# Patient Record
Sex: Female | Born: 1990 | Race: White | Hispanic: No | Marital: Single | Smoking: Never smoker
Health system: Southern US, Community
[De-identification: ages and names within clinical notes are randomized; demographics above are authoritative.]

## PROBLEM LIST (undated history)

## (undated) DIAGNOSIS — R519 Headache, unspecified: Secondary | ICD-10-CM

## (undated) DIAGNOSIS — D649 Anemia, unspecified: Secondary | ICD-10-CM

## (undated) DIAGNOSIS — R87629 Unspecified abnormal cytological findings in specimens from vagina: Secondary | ICD-10-CM

## (undated) DIAGNOSIS — R51 Headache: Secondary | ICD-10-CM

## (undated) HISTORY — PX: WISDOM TOOTH EXTRACTION: SHX21

## (undated) HISTORY — PX: HERNIA REPAIR: SHX51

---

## 2008-10-18 HISTORY — PX: COLPOSCOPY: SHX161

## 2014-05-16 ENCOUNTER — Other Ambulatory Visit (HOSPITAL_COMMUNITY): Payer: Self-pay | Admitting: Obstetrics and Gynecology

## 2014-05-16 DIAGNOSIS — I82890 Acute embolism and thrombosis of other specified veins: Secondary | ICD-10-CM

## 2014-05-17 ENCOUNTER — Inpatient Hospital Stay (HOSPITAL_COMMUNITY): Admission: RE | Admit: 2014-05-17 | Payer: Managed Care, Other (non HMO) | Source: Ambulatory Visit

## 2014-05-17 ENCOUNTER — Ambulatory Visit (HOSPITAL_COMMUNITY)
Admission: RE | Admit: 2014-05-17 | Discharge: 2014-05-17 | Disposition: A | Discharge status: Home / Self Care | Payer: Managed Care, Other (non HMO) | Source: Ambulatory Visit | Attending: Obstetrics and Gynecology | Admitting: Obstetrics and Gynecology

## 2014-05-17 DIAGNOSIS — I82890 Acute embolism and thrombosis of other specified veins: Secondary | ICD-10-CM

## 2014-05-17 DIAGNOSIS — M79609 Pain in unspecified limb: Secondary | ICD-10-CM | POA: Insufficient documentation

## 2014-05-20 ENCOUNTER — Ambulatory Visit (HOSPITAL_COMMUNITY): Payer: Managed Care, Other (non HMO)

## 2014-05-27 LAB — OB RESULTS CONSOLE GC/CHLAMYDIA
Chlamydia: NEGATIVE
Gonorrhea: NEGATIVE

## 2014-05-27 LAB — OB RESULTS CONSOLE RPR: RPR: NONREACTIVE

## 2014-05-27 LAB — OB RESULTS CONSOLE GBS: STREP GROUP B AG: POSITIVE

## 2014-05-27 LAB — OB RESULTS CONSOLE RUBELLA ANTIBODY, IGM: Rubella: IMMUNE

## 2014-05-27 LAB — OB RESULTS CONSOLE ABO/RH: RH TYPE: POSITIVE

## 2014-05-27 LAB — OB RESULTS CONSOLE HEPATITIS B SURFACE ANTIGEN: Hepatitis B Surface Ag: NEGATIVE

## 2014-05-27 LAB — OB RESULTS CONSOLE HIV ANTIBODY (ROUTINE TESTING): HIV: NONREACTIVE

## 2014-05-27 LAB — OB RESULTS CONSOLE ANTIBODY SCREEN: Antibody Screen: NEGATIVE

## 2014-10-18 NOTE — L&D Delivery Note (Signed)
Delivery Note Pt progressed to complete and pushed well.  At 12:15 PM a viable female was delivered via Vaginal, Spontaneous Delivery (Presentation: Left Occiput Anterior).  APGAR: 8, 9; weight pending.   Placenta status: Intact, Spontaneous.  Cord:  with the following complications: None.   Anesthesia: Epidural  Episiotomy: None Lacerations: None Suture Repair: none Est. Blood Loss (mL): 300  Mom to postpartum.  Baby to Couplet care / Skin to Skin.  Will continue [place] and clinda at least overnight, placenta to path.  Hans Rusher D 12/08/2014, 12:30 PM

## 2014-11-13 LAB — OB RESULTS CONSOLE GBS: GBS: POSITIVE

## 2014-12-08 ENCOUNTER — Inpatient Hospital Stay (HOSPITAL_COMMUNITY)
Admission: AD | Admit: 2014-12-08 | Discharge: 2014-12-10 | DRG: 775 | Disposition: A | Discharge status: Home / Self Care | Payer: Managed Care, Other (non HMO) | Source: Ambulatory Visit | Attending: Obstetrics and Gynecology | Admitting: Obstetrics and Gynecology

## 2014-12-08 ENCOUNTER — Inpatient Hospital Stay (HOSPITAL_COMMUNITY): Payer: Managed Care, Other (non HMO) | Admitting: Anesthesiology

## 2014-12-08 ENCOUNTER — Encounter (HOSPITAL_COMMUNITY): Payer: Self-pay

## 2014-12-08 DIAGNOSIS — O411030 Infection of amniotic sac and membranes, unspecified, third trimester, not applicable or unspecified: Secondary | ICD-10-CM | POA: Diagnosis present

## 2014-12-08 DIAGNOSIS — O99824 Streptococcus B carrier state complicating childbirth: Secondary | ICD-10-CM | POA: Diagnosis present

## 2014-12-08 DIAGNOSIS — Z3A38 38 weeks gestation of pregnancy: Secondary | ICD-10-CM | POA: Diagnosis present

## 2014-12-08 DIAGNOSIS — O471 False labor at or after 37 completed weeks of gestation: Secondary | ICD-10-CM | POA: Diagnosis present

## 2014-12-08 HISTORY — DX: Headache, unspecified: R51.9

## 2014-12-08 HISTORY — DX: Headache: R51

## 2014-12-08 HISTORY — DX: Unspecified abnormal cytological findings in specimens from vagina: R87.629

## 2014-12-08 HISTORY — DX: Anemia, unspecified: D64.9

## 2014-12-08 LAB — CBC
HCT: 35.6 % — ABNORMAL LOW (ref 36.0–46.0)
Hemoglobin: 12 g/dL (ref 12.0–15.0)
MCH: 31.7 pg (ref 26.0–34.0)
MCHC: 33.7 g/dL (ref 30.0–36.0)
MCV: 94.2 fL (ref 78.0–100.0)
PLATELETS: 289 10*3/uL (ref 150–400)
RBC: 3.78 MIL/uL — ABNORMAL LOW (ref 3.87–5.11)
RDW: 13.8 % (ref 11.5–15.5)
WBC: 22.1 10*3/uL — ABNORMAL HIGH (ref 4.0–10.5)

## 2014-12-08 LAB — POCT FERN TEST: POCT Fern Test: POSITIVE

## 2014-12-08 LAB — ABO/RH: ABO/RH(D): A POS

## 2014-12-08 LAB — TYPE AND SCREEN
ABO/RH(D): A POS
Antibody Screen: NEGATIVE

## 2014-12-08 MED ORDER — ACETAMINOPHEN 500 MG PO TABS
1000.0000 mg | ORAL_TABLET | Freq: Four times a day (QID) | ORAL | Status: DC | PRN
  Administered 2014-12-08: 1000 mg via ORAL
  Filled 2014-12-08: qty 2

## 2014-12-08 MED ORDER — OXYTOCIN BOLUS FROM INFUSION: 500.0000 mL | INTRAVENOUS | Status: DC

## 2014-12-08 MED ORDER — GENTAMICIN SULFATE 40 MG/ML IJ SOLN
Freq: Once | INTRAVENOUS | Status: AC
  Administered 2014-12-08: 08:00:00 via INTRAVENOUS
  Filled 2014-12-08: qty 4.25

## 2014-12-08 MED ORDER — EPHEDRINE 5 MG/ML INJ
10.0000 mg | INTRAVENOUS | Status: DC | PRN
Start: 2014-12-08 — End: 2014-12-08
  Filled 2014-12-08: qty 2

## 2014-12-08 MED ORDER — BUTORPHANOL TARTRATE 1 MG/ML IJ SOLN: 1.0000 mg | INTRAMUSCULAR | Status: DC | PRN

## 2014-12-08 MED ORDER — ZOLPIDEM TARTRATE 5 MG PO TABS: 5.0000 mg | ORAL_TABLET | Freq: Every evening | ORAL | Status: DC | PRN

## 2014-12-08 MED ORDER — ACETAMINOPHEN 325 MG PO TABS: 650.0000 mg | ORAL_TABLET | ORAL | Status: DC | PRN

## 2014-12-08 MED ORDER — WITCH HAZEL-GLYCERIN EX PADS: 1.0000 "application " | MEDICATED_PAD | CUTANEOUS | Status: DC | PRN

## 2014-12-08 MED ORDER — PHENYLEPHRINE 40 MCG/ML (10ML) SYRINGE FOR IV PUSH (FOR BLOOD PRESSURE SUPPORT)
80.0000 ug | PREFILLED_SYRINGE | INTRAVENOUS | Status: DC | PRN
  Filled 2014-12-08: qty 20
  Filled 2014-12-08: qty 2

## 2014-12-08 MED ORDER — CLINDAMYCIN PHOSPHATE 900 MG/50ML IV SOLN
900.0000 mg | Freq: Three times a day (TID) | INTRAVENOUS | Status: DC
  Filled 2014-12-08 (×2): qty 50

## 2014-12-08 MED ORDER — EPHEDRINE 5 MG/ML INJ
10.0000 mg | INTRAVENOUS | Status: DC | PRN
  Filled 2014-12-08: qty 2

## 2014-12-08 MED ORDER — GENTAMICIN SULFATE 40 MG/ML IJ SOLN
Freq: Once | INTRAMUSCULAR | Status: DC
  Filled 2014-12-08: qty 4.25

## 2014-12-08 MED ORDER — CEFAZOLIN SODIUM-DEXTROSE 2-3 GM-% IV SOLR
2.0000 g | Freq: Once | INTRAVENOUS | Status: AC
Start: 2014-12-08 — End: 2014-12-08
  Administered 2014-12-08: 2 g via INTRAVENOUS
  Filled 2014-12-08: qty 50

## 2014-12-08 MED ORDER — OXYCODONE-ACETAMINOPHEN 5-325 MG PO TABS: 2.0000 | ORAL_TABLET | ORAL | Status: DC | PRN

## 2014-12-08 MED ORDER — DIPHENHYDRAMINE HCL 50 MG/ML IJ SOLN: 12.5000 mg | INTRAMUSCULAR | Status: DC | PRN

## 2014-12-08 MED ORDER — SENNOSIDES-DOCUSATE SODIUM 8.6-50 MG PO TABS
2.0000 | ORAL_TABLET | ORAL | Status: DC
  Administered 2014-12-09 (×2): 2 via ORAL
  Filled 2014-12-08 (×2): qty 2

## 2014-12-08 MED ORDER — OXYTOCIN 40 UNITS IN LACTATED RINGERS INFUSION - SIMPLE MED
1.0000 m[IU]/min | INTRAVENOUS | Status: DC
  Administered 2014-12-08: 2 m[IU]/min via INTRAVENOUS
  Filled 2014-12-08: qty 1000

## 2014-12-08 MED ORDER — OXYCODONE-ACETAMINOPHEN 5-325 MG PO TABS: 1.0000 | ORAL_TABLET | ORAL | Status: DC | PRN

## 2014-12-08 MED ORDER — LANOLIN HYDROUS EX OINT: TOPICAL_OINTMENT | CUTANEOUS | Status: DC | PRN

## 2014-12-08 MED ORDER — CITRIC ACID-SODIUM CITRATE 334-500 MG/5ML PO SOLN: 30.0000 mL | ORAL | Status: DC | PRN

## 2014-12-08 MED ORDER — SIMETHICONE 80 MG PO CHEW: 80.0000 mg | CHEWABLE_TABLET | ORAL | Status: DC | PRN

## 2014-12-08 MED ORDER — ONDANSETRON HCL 4 MG/2ML IJ SOLN: 4.0000 mg | Freq: Four times a day (QID) | INTRAMUSCULAR | Status: DC | PRN

## 2014-12-08 MED ORDER — ONDANSETRON HCL 4 MG PO TABS: 4.0000 mg | ORAL_TABLET | ORAL | Status: DC | PRN

## 2014-12-08 MED ORDER — FENTANYL 2.5 MCG/ML BUPIVACAINE 1/10 % EPIDURAL INFUSION (WH - ANES)
14.0000 mL/h | INTRAMUSCULAR | Status: DC | PRN
  Filled 2014-12-08: qty 125

## 2014-12-08 MED ORDER — DIPHENHYDRAMINE HCL 25 MG PO CAPS: 25.0000 mg | ORAL_CAPSULE | Freq: Four times a day (QID) | ORAL | Status: DC | PRN

## 2014-12-08 MED ORDER — LIDOCAINE HCL (PF) 1 % IJ SOLN
INTRAMUSCULAR | Status: DC | PRN
  Administered 2014-12-08 (×2): 5 mL
  Administered 2014-12-08: 3 mL

## 2014-12-08 MED ORDER — BENZOCAINE-MENTHOL 20-0.5 % EX AERO: 1.0000 "application " | INHALATION_SPRAY | CUTANEOUS | Status: DC | PRN

## 2014-12-08 MED ORDER — GENTAMICIN SULFATE 40 MG/ML IJ SOLN
Freq: Three times a day (TID) | INTRAVENOUS | Status: DC
  Administered 2014-12-08 – 2014-12-09 (×3): via INTRAVENOUS
  Filled 2014-12-08 (×4): qty 3.75

## 2014-12-08 MED ORDER — PRENATAL MULTIVITAMIN CH
1.0000 | ORAL_TABLET | Freq: Every day | ORAL | Status: DC
  Administered 2014-12-09: 1 via ORAL
  Filled 2014-12-08: qty 1

## 2014-12-08 MED ORDER — TERBUTALINE SULFATE 1 MG/ML IJ SOLN
0.2500 mg | Freq: Once | INTRAMUSCULAR | Status: DC | PRN
  Filled 2014-12-08: qty 1

## 2014-12-08 MED ORDER — PHENYLEPHRINE 40 MCG/ML (10ML) SYRINGE FOR IV PUSH (FOR BLOOD PRESSURE SUPPORT)
80.0000 ug | PREFILLED_SYRINGE | INTRAVENOUS | Status: DC | PRN
  Filled 2014-12-08: qty 2

## 2014-12-08 MED ORDER — MAGNESIUM HYDROXIDE 400 MG/5ML PO SUSP: 30.0000 mL | ORAL | Status: DC | PRN

## 2014-12-08 MED ORDER — METHYLERGONOVINE MALEATE 0.2 MG PO TABS: 0.2000 mg | ORAL_TABLET | ORAL | Status: DC | PRN

## 2014-12-08 MED ORDER — LACTATED RINGERS IV SOLN: 500.0000 mL | Freq: Once | INTRAVENOUS | Status: DC

## 2014-12-08 MED ORDER — LACTATED RINGERS IV SOLN
INTRAVENOUS | Status: DC
  Administered 2014-12-08 (×2): via INTRAVENOUS

## 2014-12-08 MED ORDER — DIBUCAINE 1 % RE OINT: 1.0000 "application " | TOPICAL_OINTMENT | RECTAL | Status: DC | PRN

## 2014-12-08 MED ORDER — CEFAZOLIN SODIUM 1-5 GM-% IV SOLN
1.0000 g | Freq: Three times a day (TID) | INTRAVENOUS | Status: DC
  Filled 2014-12-08: qty 50

## 2014-12-08 MED ORDER — LACTATED RINGERS IV SOLN: 500.0000 mL | INTRAVENOUS | Status: DC | PRN

## 2014-12-08 MED ORDER — OXYTOCIN 40 UNITS IN LACTATED RINGERS INFUSION - SIMPLE MED: 62.5000 mL/h | INTRAVENOUS | Status: DC

## 2014-12-08 MED ORDER — IBUPROFEN 600 MG PO TABS
600.0000 mg | ORAL_TABLET | Freq: Four times a day (QID) | ORAL | Status: DC
  Administered 2014-12-08 – 2014-12-10 (×8): 600 mg via ORAL
  Filled 2014-12-08 (×8): qty 1

## 2014-12-08 MED ORDER — TETANUS-DIPHTH-ACELL PERTUSSIS 5-2.5-18.5 LF-MCG/0.5 IM SUSP: 0.5000 mL | Freq: Once | INTRAMUSCULAR | Status: DC

## 2014-12-08 MED ORDER — LIDOCAINE HCL (PF) 1 % IJ SOLN
30.0000 mL | INTRAMUSCULAR | Status: DC | PRN
  Filled 2014-12-08: qty 30

## 2014-12-08 MED ORDER — ONDANSETRON HCL 4 MG/2ML IJ SOLN: 4.0000 mg | INTRAMUSCULAR | Status: DC | PRN

## 2014-12-08 MED ORDER — METHYLERGONOVINE MALEATE 0.2 MG/ML IJ SOLN: 0.2000 mg | INTRAMUSCULAR | Status: DC | PRN

## 2014-12-08 MED ORDER — FENTANYL 2.5 MCG/ML BUPIVACAINE 1/10 % EPIDURAL INFUSION (WH - ANES)
INTRAMUSCULAR | Status: DC | PRN
  Administered 2014-12-08: 14 mL/h via EPIDURAL

## 2014-12-08 MED ORDER — FLEET ENEMA 7-19 GM/118ML RE ENEM: 1.0000 | ENEMA | RECTAL | Status: DC | PRN

## 2014-12-08 MED ORDER — MEASLES, MUMPS & RUBELLA VAC ~~LOC~~ INJ
0.5000 mL | INJECTION | Freq: Once | SUBCUTANEOUS | Status: DC
  Filled 2014-12-08: qty 0.5

## 2014-12-08 NOTE — Progress Notes (Signed)
ANTIBIOTIC CONSULT NOTE - INITIAL  Pharmacy Consult for gentamicin Indication: maternal fever - R/O chorioamnionitis  Allergies  Allergen Reactions  . Penicillins Swelling  . Sulfa Antibiotics Hives  . Augmentin [Amoxicillin-Pot Clavulanate] Rash    Patient Measurements: Height: 5\' 4"  (162.6 cm) Weight: 151 lb (68.493 kg) IBW/kg (Calculated) : 54.7 Adjusted Body Weight: 59 kg  Vital Signs: Temp: 100.6 F (38.1 C) (02/21 0631) Temp Source: Oral (02/21 0631) BP: 112/67 mmHg (02/21 0631) Pulse Rate: 106 (02/21 0631) Intake/Output from previous day:   Intake/Output from this shift:    Labs: No results for input(s): WBC, HGB, PLT, LABCREA, CREATININE in the last 72 hours. CrCl cannot be calculated (Patient has no serum creatinine result on file.). No results for input(s): VANCOTROUGH, VANCOPEAK, VANCORANDOM, GENTTROUGH, GENTPEAK, GENTRANDOM, TOBRATROUGH, TOBRAPEAK, TOBRARND, AMIKACINPEAK, AMIKACINTROU, AMIKACIN in the last 72 hours.   Microbiology: No results found for this or any previous visit (from the past 720 hour(s)).  Medical History: Past Medical History  Diagnosis Date  . Anemia   . Vaginal Pap smear, abnormal     Colposcopy 2010  . Headache     Hx     Medications:  Scheduled:  . gentamicin (GARAMYCIN) with clindamycin (CLEOCIN) IV   Intravenous Q8H  . gentamicin (GARAMYCIN) with clindamycin (CLEOCIN) IV   Intravenous Once   Followed by  . gentamicin (GARAMYCIN) with clindamycin (CLEOCIN) IV   Intravenous Once   Infusions:  . lactated ringers    . oxytocin 40 units in LR 1000 mL    . oxytocin 40 units in LR 1000 mL     PRN: acetaminophen, acetaminophen, butorphanol, citric acid-sodium citrate, lactated ringers, lidocaine (PF), ondansetron, oxyCODONE-acetaminophen, oxyCODONE-acetaminophen, sodium phosphate Assessment: [age]yr G2P1 38+ wk gest now with maternal fever.  Patient has allergy to PCN.  To treat possible chorioamnionitis with gent and  clindamycin.  No Serum creatinine - therefore, will assume usual normal CrCl 125-12635ml/min for age and pregnancy.  Goal of Therapy:  Desire gentamicin peak level 6-168mcg/ml and trough level < 241mcg/ml  Plan:  1. Gentamicin 170mg  & clindamycin 900mg  IV x 1 loading dose 2. (if continued) maintenance regimen gentamicin 150mg  & clindamycin 900mg  IV q8h 3. (if continued) will check a serum creatinine to assure est CrCl  Scarlett PrestoRochette, Sayler Mickiewicz E 12/08/2014,7:12 AM

## 2014-12-08 NOTE — H&P (Signed)
Charlotte Sweeney is a 24 y.o. female, G2 P1001, EGA 38+ weeks with EDC 2-29 presenting for ctx and possibly leaking fluid.  She was seen in the office late last week by Dr. Ellyn HackBovard, unable to confirm ROM.  She has continued to leak fluid off and on, ctx getting stronger.  On eval in MAU, + fern, irreg ctx, VE 4 cm dilated.  Prenatal care essentially uncomplicated, see prenatal records for complete history.  Maternal Medical History:  Reason for admission: Rupture of membranes and contractions.   Contractions: Frequency: irregular.   Perceived severity is moderate.    Fetal activity: Perceived fetal activity is normal.      OB History    Gravida Para Term Preterm AB TAB SAB Ectopic Multiple Living   2 1 1       1      Past Medical History  Diagnosis Date  . Anemia   . Vaginal Pap smear, abnormal     Colposcopy 2010  . Headache     Hx    Past Surgical History  Procedure Laterality Date  . Hernia repair    . Wisdom tooth extraction    . Colposcopy  2010   Family History: family history is not on file. Social History:  reports that she has never smoked. She has never used smokeless tobacco. She reports that she does not drink alcohol or use illicit drugs.   Prenatal Transfer Tool  Maternal Diabetes: No Genetic Screening: Normal Maternal Ultrasounds/Referrals: Normal Fetal Ultrasounds or other Referrals:  None Maternal Substance Abuse:  No Significant Maternal Medications:  None Significant Maternal Lab Results:  Lab values include: Group B Strep positive Other Comments:  None  Review of Systems  Respiratory: Negative.   Cardiovascular: Negative.     Dilation: 5 Effacement (%): 90 Station: -1 Exam by:: Sameria Morss Blood pressure 98/58, pulse 99, temperature 98.4 F (36.9 C), temperature source Axillary, resp. rate 20, height 5\' 4"  (1.626 m), weight 68.493 kg (151 lb). Maternal Exam:  Uterine Assessment: Contraction strength is moderate.  Contraction frequency is  irregular.   Abdomen: Estimated fetal weight is 7 lbs.   Fetal presentation: vertex  Introitus: Normal vulva. Normal vagina.  Ferning test: positive.  Amniotic fluid character: clear.  Pelvis: adequate for delivery.   Cervix: Cervix evaluated by digital exam.     Fetal Exam Fetal Monitor Review: Mode: ultrasound.   Variability: moderate (6-25 bpm).   Pattern: accelerations present and no decelerations.    Fetal State Assessment: Category I - tracings are normal.     Physical Exam  Vitals reviewed. Constitutional: She appears well-developed and well-nourished.  Cardiovascular: Normal rate, regular rhythm and normal heart sounds.   No murmur heard. Respiratory: Effort normal and breath sounds normal. No respiratory distress. She has no wheezes.  GI: Soft.    Prenatal labs: ABO, Rh: --/--/A POS (02/21 0600) Antibody: NEG (02/21 0600) Rubella: Immune (08/10 0000) RPR: Nonreactive (08/10 0000)  HBsAg: Negative (08/10 0000)  HIV: Non-reactive (08/10 0000)  GBS: Positive (01/27 0000)   Assessment/Plan: IUP at 38+ weeks with possible prolonged ROM in early labor, also with low grade fever c/w possible chorioamnionitis, +GBS.  Will augment labor with pitocin, Gent and clinda to cover for possible chorio, Ancef for +GBS.     Rozalynn Buege D 12/08/2014, 9:45 AM

## 2014-12-08 NOTE — MAU Note (Signed)
[facility] OK for pt to go to [phone]

## 2014-12-08 NOTE — Anesthesia Procedure Notes (Signed)
Epidural Patient location during procedure: OB  Staffing Anesthesiologist: Yolando Gillum Performed by: anesthesiologist   Preanesthetic Checklist Completed: patient identified, site marked, surgical consent, pre-op evaluation, timeout performed, IV checked, risks and benefits discussed and monitors and equipment checked  Epidural Patient position: sitting Prep: ChloraPrep Patient monitoring: heart rate, continuous pulse ox and blood pressure Approach: right paramedian Location: L3-L4 Injection technique: LOR saline  Needle:  Needle type: Tuohy  Needle gauge: 17 G Needle length: 9 cm and 9 Needle insertion depth: 4 cm Catheter type: closed end flexible Catheter size: 20 Guage Catheter at skin depth: 9 cm Test dose: negative  Assessment Events: blood not aspirated, injection not painful, no injection resistance, negative IV test and no paresthesia  Additional Notes   Patient tolerated the insertion well without complications.   

## 2014-12-08 NOTE — Anesthesia Preprocedure Evaluation (Signed)

## 2014-12-08 NOTE — MAU Note (Addendum)
Thinks water broke on Tuesday, Pt went to office on Thursday and reports the strip turned blue at office but microscope test was negative.  Clear fluid, occas pink smear.  Baby moving well. Contractions stronger now.  Was 4 cm at office visit.

## 2014-12-09 LAB — RPR: RPR: NONREACTIVE

## 2014-12-09 NOTE — Progress Notes (Addendum)
PPD #1 No problems, baby went to NICU for respiratory issues Afeb, VSS Fundus firm, NT at U-1 Continue routine postpartum care, will d/c antibiotics since afebrile

## 2014-12-09 NOTE — Anesthesia Postprocedure Evaluation (Signed)
Anesthesia Post Note  Patient: [name]  Procedure(s) Performed: * No procedures listed *  Anesthesia type: Epidural  Patient location: Mother/Baby  Post pain: Pain level controlled  Post assessment: Post-op Vital signs reviewed  Last Vitals:  Filed Vitals:   12/09/14 0626  BP: 100/67  Pulse: 59  Temp: 36.6 C  Resp: 18    Post vital signs: Reviewed  Level of consciousness:alert  Complications: No apparent anesthesia complications

## 2014-12-09 NOTE — Lactation Note (Signed)
This note was copied from the chart of Charlotte Sweeney. Lactation Consultation Note Experienced BF mom has a 4 yr. Old whom she stated she mainly pumped and bottle fed for 10 1/2 months. Mom has small nipples and areolas. Both are everted nipples, Rt. Nipple has slight invert in center of nipple, mom states pulls out like the Lt.. Has great colostrum when hand expressed.  RN set up DEBP and mom has pumped once reviewed supply and demand, and needing to pump every 3 hours for 15-20 min. For breast stimulation if baby hasn't been to breast. Gave colostrum vial, has labels. Encouraged to massage breast during BF and pumping. Mom shown how to use DEBP & how to disassemble, clean, & reassemble parts. Mom encouraged to do skin-to-skin when BF baby or visiting baby if possible in NICU. Referred to Baby and Me Book in Breastfeeding section Pg. 22-23 for position options and Proper latch demonstration. WH/LC brochure given w/resources, support groups and LC services. Patient Name: Charlotte Sweeney EAVWU'JToday's Date: 12/09/2014 Reason for consult: Initial assessment   Maternal Data Has patient been taught Hand Expression?: Yes Does the patient have breastfeeding experience prior to this delivery?: Yes  Feeding    LATCH Score/Interventions                      Lactation Tools Discussed/Used Tools: Pump Breast pump type: Double-Electric Breast Pump Pump Review: Setup, frequency, and cleaning;Milk Storage Initiated by:: RN/L. Meta Kroenke RN Date initiated:: 12/08/14   Consult Status Consult Status: Follow-up Date: 12/09/14 (pm) Follow-up type: In-patient    Charlotte Sweeney, Charlotte Sweeney 12/09/2014, 5:47 AM

## 2014-12-09 NOTE — Progress Notes (Signed)
CSW met briefly with MOB at baby's bedside to introduce myself and offer support.  CSW services explained.  MOB states she is doing well.  She appears in good spirits.  She states no questions, needs or concerns at this time.  CSW will check in later when MOB is back in her room if possible. 

## 2014-12-09 NOTE — Progress Notes (Signed)
CSW attempted to meet with MOB to offer support and complete assessment due to NICU admission, but she was not in her room at this time.  CSW will attempt again at a later time. 

## 2014-12-10 MED ORDER — IBUPROFEN 600 MG PO TABS: 600.0000 mg | ORAL_TABLET | Freq: Four times a day (QID) | ORAL | Status: DC

## 2014-12-10 NOTE — Clinical Social Work Maternal (Signed)
Clinical Social Work Department PSYCHOSOCIAL ASSESSMENT - MATERNAL/CHILD 12/10/2014  Patient:  Charlotte Sweeney,Charlotte Sweeney  Account Number:  [id number]  Admit Date:  12/08/2014  Childs Name:   Brooklyn    Clinical Social Worker:  Brenn Gatton, LCSWA   Date/Time:  12/10/2014 09:51 AM  Date Referred:  12/09/2014   Referral source  Physician     Referred reason  NICU   Other referral source:    I:  FAMILY / HOME ENVIRONMENT Child's legal guardian:  PARENT  Guardian - Name Guardian - Age Guardian - Address  Charlotte Sweeney 23 [address] [place], [place]  Charlotte Sweeney  same   Other household support members/support persons Name Relationship DOB  Alyssa  26yo   Other support:   FOB has local family who are supportive and involved- currently helping with 4yo child.  MOB reports her mother is coming today but lives about 4 hours away    II  PSYCHOSOCIAL DATA Information Source:  Patient Interview  Financial and Community Resources Employment:   FOB- [employer]   Financial resources:  Private Insurance If Medicaid - County:  [place]  School / Grade:  12 Maternity Care Coordinator / Child Services Coordination / Early Interventions:  Cultural issues impacting care:   none noted/identified    III  STRENGTHS Strengths  Adequate Resources  Home prepared for Child (including basic supplies)  Supportive family/friends  Understanding of illness   Strength comment:    IV  RISK FACTORS AND CURRENT PROBLEMS Current Problem:  None   Risk Factor & Current Problem Patient Issue Family Issue Risk Factor / Current Problem Comment   N N     V  SOCIAL WORK ASSESSMENT CSW met with MOB along with FOB today. CSW introduced self/role and reason for follow up (NICU baby). Both parents voice a good understanding of why Brooklyn was taken to the NICU and state they are "comfortable and feel good" about what the staff are doing.  MOB reports she has just spoken with NP and states, "we may  room in with her tonight".  CSW talked with parents about the "rooming in" option and they appear open to this if Brooklyn is not able to dc home today. "I can see her doing better and know she's in good hands".    Both parents state they have great support from extended family and friends- they also report the home is prepared for baby with supplies, crib, etc.      VI SOCIAL WORK PLAN Social Work Plan  Other   Type of pt/family education:   CSW educated parents on the "room in" option that the staff are considering if she is unable to dc home today.    CSW  provided support and explored their feelings about baby being in NICU- they voice an understanding and "comfort" with this.   If child protective services report - county:   If child protective services report - date:   Information/referral to community resources comment:   Other social work plan:   CSW offered support and will follow up if baby remains inpatient- otherwise, parents are prepared and excited to take Brooklyn home.    Chasin Findling, MSW, LCSWA [phone] 

## 2014-12-10 NOTE — Lactation Note (Signed)
This note was copied from the chart of Charlotte Sweeney. Lactation Consultation Note  Follow up visit made.  Mom states baby just finished a good 30 minute feeding.  Baby is content and relaxed.  Instructed to keep a feeding diary the first week baby is home.  Reviewed normal output if baby is obtaining adequate amount of breastmilk.  Follow up weight checks at pediatrician the first two weeks will be important.  Lactation outpatient services and support encouraged.  Patient Name: Charlotte Lelon MastSamantha Akers QIONG'EToday's Date: 12/10/2014     Maternal Data    Feeding Feeding Type: Breast Fed Length of feed: 30 min  LATCH Score/Interventions Latch: Grasps breast easily, tongue down, lips flanged, rhythmical sucking.  Audible Swallowing: Spontaneous and intermittent  Type of Nipple: Everted at rest and after stimulation        Hold (Positioning): No assistance needed to correctly position infant at breast.     Lactation Tools Discussed/Used     Consult Status      Huston FoleyMOULDEN, Rodolph Hagemann S 12/10/2014, 3:15 PM

## 2014-12-10 NOTE — Progress Notes (Signed)
UR chart review completed.  

## 2014-12-10 NOTE — Discharge Summary (Signed)
Obstetric Discharge Summary Reason for Admission: onset of labor and rupture of membranes Prenatal Procedures: none Intrapartum Procedures: spontaneous vaginal delivery Postpartum Procedures: antibiotics Complications-Operative and Postpartum: none HEMOGLOBIN  Date Value Ref Range Status  12/08/2014 12.0 12.0 - 15.0 g/dL Final   HCT  Date Value Ref Range Status  12/08/2014 35.6* 36.0 - 46.0 % Final    Physical Exam:  General: alert Lochia: appropriate Uterine Fundus: firm   Discharge Diagnoses: Term Pregnancy-delivered and Amnionitis  Discharge Information: Date: 12/10/2014 Activity: pelvic rest Diet: routine Medications: Ibuprofen Condition: stable Instructions: refer to practice specific booklet Discharge to: home Follow-up Information    Follow up with Hensley Aziz D, MD.   Specialty:  Obstetrics and Gynecology   Contact information:   [address], [address] [place] [id number] [phone]       Newborn Data: Live born female  Birth Weight: 8 lb 14.3 oz (4035 g) APGAR: 8, 9   Baby in NICU.  Macarthur Lorusso D 12/10/2014, 8:33 AM

## 2014-12-10 NOTE — Discharge Instructions (Signed)
As per discharge pamphlet °

## 2014-12-10 NOTE — Lactation Note (Signed)
This note was copied from the chart of Charlotte Sweeney. Lactation Consultation Note  Follow up visit with Mom in NICU.  She states baby just breastfed on both breasts for 15 minutes.  Mom is experienced breastfeeding first baby.  She has been pumping 20-30 mls and reports breasts feeling heavier this AM.  Instructed to call for concerns/assist prn.  Patient Name: Charlotte Sweeney's Date: 12/10/2014     Maternal Data    Feeding Feeding Type: Breast Milk Nipple Type: Slow - flow Length of feed: 25 min  LATCH Score/Interventions Latch: Grasps breast easily, tongue down, lips flanged, rhythmical sucking.  Audible Swallowing: A few with stimulation  Type of Nipple: Everted at rest and after stimulation        Hold (Positioning): No assistance needed to correctly position infant at breast.     Lactation Tools Discussed/Used     Consult Status      Huston FoleyMOULDEN, Vollie Brunty S 12/10/2014, 12:00 PM

## 2014-12-10 NOTE — Progress Notes (Signed)
PPD #2 Doing well, cramps with nursing, baby stable in NICU Afeb, VSS D/c home

## 2015-10-10 ENCOUNTER — Encounter (HOSPITAL_COMMUNITY): Payer: Self-pay | Admitting: Emergency Medicine

## 2015-10-10 ENCOUNTER — Emergency Department (HOSPITAL_COMMUNITY)
Admission: EM | Admit: 2015-10-10 | Discharge: 2015-10-10 | Disposition: A | Discharge status: Home / Self Care | Payer: Managed Care, Other (non HMO) | Attending: Emergency Medicine | Admitting: Emergency Medicine

## 2015-10-10 DIAGNOSIS — Z793 Long term (current) use of hormonal contraceptives: Secondary | ICD-10-CM | POA: Insufficient documentation

## 2015-10-10 DIAGNOSIS — Z792 Long term (current) use of antibiotics: Secondary | ICD-10-CM | POA: Insufficient documentation

## 2015-10-10 DIAGNOSIS — Z23 Encounter for immunization: Secondary | ICD-10-CM | POA: Diagnosis not present

## 2015-10-10 DIAGNOSIS — D649 Anemia, unspecified: Secondary | ICD-10-CM | POA: Insufficient documentation

## 2015-10-10 DIAGNOSIS — Y9289 Other specified places as the place of occurrence of the external cause: Secondary | ICD-10-CM | POA: Diagnosis not present

## 2015-10-10 DIAGNOSIS — Z880 Allergy status to penicillin: Secondary | ICD-10-CM | POA: Insufficient documentation

## 2015-10-10 DIAGNOSIS — Z79899 Other long term (current) drug therapy: Secondary | ICD-10-CM | POA: Insufficient documentation

## 2015-10-10 DIAGNOSIS — Y998 Other external cause status: Secondary | ICD-10-CM | POA: Diagnosis not present

## 2015-10-10 DIAGNOSIS — Y9389 Activity, other specified: Secondary | ICD-10-CM | POA: Insufficient documentation

## 2015-10-10 DIAGNOSIS — W268XXA Contact with other sharp object(s), not elsewhere classified, initial encounter: Secondary | ICD-10-CM | POA: Insufficient documentation

## 2015-10-10 DIAGNOSIS — S61011A Laceration without foreign body of right thumb without damage to nail, initial encounter: Secondary | ICD-10-CM | POA: Insufficient documentation

## 2015-10-10 DIAGNOSIS — S61219A Laceration without foreign body of unspecified finger without damage to nail, initial encounter: Secondary | ICD-10-CM

## 2015-10-10 MED ORDER — HYDROCODONE-ACETAMINOPHEN 5-325 MG PO TABS: ORAL_TABLET | ORAL | Status: AC

## 2015-10-10 MED ORDER — LIDOCAINE HCL (PF) 1 % IJ SOLN
5.0000 mL | Freq: Once | INTRAMUSCULAR | Status: DC
  Filled 2015-10-10: qty 5

## 2015-10-10 MED ORDER — TETANUS-DIPHTH-ACELL PERTUSSIS 5-2.5-18.5 LF-MCG/0.5 IM SUSP
0.5000 mL | Freq: Once | INTRAMUSCULAR | Status: AC
  Administered 2015-10-10: 0.5 mL via INTRAMUSCULAR
  Filled 2015-10-10: qty 0.5

## 2015-10-10 MED ORDER — LIDOCAINE HCL (PF) 1 % IJ SOLN
5.0000 mL | Freq: Once | INTRAMUSCULAR | Status: AC
  Administered 2015-10-10: 5 mL

## 2015-10-10 NOTE — Discharge Instructions (Signed)
Please keep your wound clean and dry. Please have the sutures removed in 7 or 8 days. Please see your primary physician, or return to the emergency department if any signs of infection. Stitches, Staples, or Adhesive Wound Closure Health care providers use stitches (sutures), staples, and certain glue (skin adhesives) to hold skin together while it heals (wound closure). You may need this treatment after you have surgery or if you cut your skin accidentally. These methods help your skin to heal more quickly and make it less likely that you will have a scar. A wound may take several months to heal completely. The type of wound you have determines when your wound gets closed. In most cases, the wound is closed as soon as possible (primary skin closure). Sometimes, closure is delayed so the wound can be cleaned and allowed to heal naturally. This reduces the chance of infection. Delayed closure may be needed if your wound:  Is caused by a bite.  Happened more than 6 hours ago.  Involves loss of skin or the tissues under the skin.  Has dirt or debris in it that cannot be removed.  Is infected. WHAT ARE THE DIFFERENT KINDS OF WOUND CLOSURES? There are many options for wound closure. The one that your health care provider uses depends on how deep and how large your wound is. Adhesive Glue To use this type of glue to close a wound, your health care provider holds the edges of the wound together and paints the glue on the surface of your skin. You may need more than one layer of glue. Then the wound may be covered with a light bandage (dressing). This type of skin closure may be used for small wounds that are not deep (superficial). Using glue for wound closure is less painful than other methods. It does not require a medicine that numbs the area (local anesthetic). This method also leaves nothing to be removed. Adhesive glue is often used for children and on facial wounds. Adhesive glue cannot be used  for wounds that are deep, uneven, or bleeding. It is not used inside of a wound.  Adhesive Strips These strips are made of sticky (adhesive), porous paper. They are applied across your skin edges like a regular adhesive bandage. You leave them on until they fall off. Adhesive strips may be used to close very superficial wounds. They may also be used along with sutures to improve the closure of your skin edges.  Sutures Sutures are the oldest method of wound closure. Sutures can be made from natural substances, such as silk, or from synthetic materials, such as nylon and steel. They can be made from a material that your body can break down as your wound heals (absorbable), or they can be made from a material that needs to be removed from your skin (nonabsorbable). They come in many different strengths and sizes. Your health care provider attaches the sutures to a steel needle on one end. Sutures can be passed through your skin, or through the tissues beneath your skin. Then they are tied and cut. Your skin edges may be closed in one continuous stitch or in separate stitches. Sutures are strong and can be used for all kinds of wounds. Absorbable sutures may be used to close tissues under the skin. The disadvantage of sutures is that they may cause skin reactions that lead to infection. Nonabsorbable sutures need to be removed. Staples When surgical staples are used to close a wound, the edges of your  skin on both sides of the wound are brought close together. A staple is placed across the wound, and an instrument secures the edges together. Staples are often used to close surgical cuts (incisions). Staples are faster to use than sutures, and they cause less skin reaction. Staples need to be removed using a tool that bends the staples away from your skin. HOW DO I CARE FOR MY WOUND CLOSURE?  Take medicines only as directed by your health care provider.  If you were prescribed an antibiotic medicine for  your wound, finish it all even if you start to feel better.  Use ointments or creams only as directed by your health care provider.  Wash your hands with soap and water before and after touching your wound.  Do not soak your wound in water. Do not take baths, swim, or use a hot tub until your health care provider approves.  Ask your health care provider when you can start showering. Cover your wound if directed by your health care provider.  Do not take out your own sutures or staples.  Do not pick at your wound. Picking can cause an infection.  Keep all follow-up visits as directed by your health care provider. This is important. HOW LONG WILL I HAVE MY WOUND CLOSURE?  Leave adhesive glue on your skin until the glue peels away.  Leave adhesive strips on your skin until the strips fall off.  Absorbable sutures will dissolve within several days.  Nonabsorbable sutures and staples must be removed. The location of the wound will determine how long they stay in. This can range from several days to a couple of weeks. WHEN SHOULD I SEEK HELP FOR MY WOUND CLOSURE? Contact your health care provider if:  You have a fever.  You have chills.  You have drainage, redness, swelling, or pain at your wound.  There is a bad smell coming from your wound.  The skin edges of your wound start to separate after your sutures have been removed.  Your wound becomes thick, raised, and darker in color after your sutures come out (scarring).   This information is not intended to replace advice given to you by your health care provider. Make sure you discuss any questions you have with your health care provider.   Document Released: 06/29/2001 Document Revised: 10/25/2014 Document Reviewed: 03/13/2014 Elsevier Interactive Patient Education [employer]2016 [employer]

## 2015-10-10 NOTE — ED Notes (Signed)
Pt reports a can didn't open all the way and pt was trying to open it, cutting her right thumb, pt has laceration to right ant thumb.

## 2015-10-10 NOTE — ED Provider Notes (Signed)
CSN: [id number]     Arrival date & time 10/10/15  1153 History   First MD Initiated Contact with Patient 10/10/15 1236     Chief Complaint  Patient presents with  . Laceration     (Consider location/radiation/quality/duration/timing/severity/associated sxs/prior Treatment) Patient is a 24 y.o. female presenting with skin laceration. The history is provided by the patient.  Laceration Location:  Finger Finger laceration location:  R thumb Length (cm):  2.2 Depth:  Cutaneous Quality: jagged   Bleeding: controlled   Laceration mechanism:  Metal edge Pain details:    Quality:  Aching   Severity:  Moderate   Timing:  Intermittent   Progression:  Worsening Foreign body present:  No foreign bodies Relieved by:  Nothing Worsened by:  Movement Tetanus status:  Up to date   Past Medical History  Diagnosis Date  . Anemia   . Vaginal Pap smear, abnormal     Colposcopy 2010  . Headache     Hx    Past Surgical History  Procedure Laterality Date  . Hernia repair    . Wisdom tooth extraction    . Colposcopy  2010   History reviewed. No pertinent family history. Social History  Substance Use Topics  . Smoking status: Never Smoker   . Smokeless tobacco: Never Used  . Alcohol Use: No   OB History    Gravida Para Term Preterm AB TAB SAB Ectopic Multiple Living   2 2 2       0 2     Review of Systems  Constitutional: Negative for activity change.       All ROS Neg except as noted in HPI  HENT: Negative for nosebleeds.   Eyes: Negative for photophobia and discharge.  Respiratory: Negative for cough, shortness of breath and wheezing.   Cardiovascular: Negative for chest pain and palpitations.  Gastrointestinal: Negative for abdominal pain and blood in stool.  Genitourinary: Negative for dysuria, frequency and hematuria.  Musculoskeletal: Negative for back pain, arthralgias and neck pain.  Skin: Negative.   Neurological: Negative for dizziness, seizures and speech  difficulty.  Psychiatric/Behavioral: Negative for hallucinations and confusion.      Allergies  Penicillins; Sulfa antibiotics; and Augmentin  Home Medications   Prior to Admission medications   Medication Sig Start Date End Date Taking? Authorizing Provider  acetaminophen (TYLENOL) 500 MG tablet Take 500 mg by mouth every 6 (six) hours as needed for mild pain or headache.    Yes Historical Provider, MD  azithromycin (ZITHROMAX) 250 MG tablet Take 250 mg by mouth daily.   Yes Historical Provider, MD  Ferrous Sulfate Dried 45 MG TBCR Take 1 tablet by mouth daily.   Yes Historical Provider, MD  ibuprofen (ADVIL,MOTRIN) 200 MG tablet Take 400-600 mg by mouth every 6 (six) hours as needed for moderate pain.   Yes Historical Provider, MD  norethindrone (CAMILA) 0.35 MG tablet Take 1 tablet by mouth daily.   Yes Historical Provider, MD   BP 117/76 mmHg  Pulse 84  Temp(Src) 97.9 F (36.6 C) (Oral)  Resp 16  Ht 5\' 4"  (1.626 m)  Wt 54.432 kg  BMI 20.59 kg/m2  SpO2 100% Physical Exam  Constitutional: She is oriented to person, place, and time. She appears well-developed and well-nourished.  Non-toxic appearance.  HENT:  Head: Normocephalic.  Right Ear: Tympanic membrane and external ear normal.  Left Ear: Tympanic membrane and external ear normal.  Eyes: EOM and lids are normal. Pupils are equal, round, and reactive to  light.  Neck: Normal range of motion. Neck supple. Carotid bruit is not present.  Cardiovascular: Normal rate, regular rhythm, normal heart sounds, intact distal pulses and normal pulses.   Pulmonary/Chest: Breath sounds normal. No respiratory distress.  Abdominal: Soft. Bowel sounds are normal. There is no tenderness. There is no guarding.  Musculoskeletal: Normal range of motion.  2.2 cm laceration of the plantar lateral surface of the right thumb. No bone or tendon involvement.  Lymphadenopathy:       Head (right side): No submandibular adenopathy present.        Head (left side): No submandibular adenopathy present.    She has no cervical adenopathy.  Neurological: She is alert and oriented to person, place, and time. She has normal strength. No cranial nerve deficit or sensory deficit.  Skin: Skin is warm and dry.  Psychiatric: She has a normal mood and affect. Her speech is normal.  Nursing note and vitals reviewed.   ED Course  .Marland KitchenLaceration Repair Date/Time: 10/10/2015 1:50 PM Performed by: Ivery Quale Authorized by: Ivery Quale Consent: Verbal consent obtained. Risks and benefits: risks, benefits and alternatives were discussed Consent given by: patient Patient understanding: patient states understanding of the procedure being performed Patient identity confirmed: arm band Time out: Immediately prior to procedure a "time out" was called to verify the correct patient, procedure, equipment, support staff and site/side marked as required. Body area: upper extremity Location details: right thumb Laceration length: 2.2 cm Foreign bodies: no foreign bodies Vascular damage: no Anesthesia: digital block Local anesthetic: lidocaine 1% without epinephrine Patient sedated: no Preparation: Patient was prepped and draped in the usual sterile fashion. Irrigation solution: saline Amount of cleaning: standard Skin closure: 4-0 nylon Number of sutures: 6 Technique: simple Approximation: loose Approximation difficulty: simple Dressing: gauze roll Patient tolerance: Patient tolerated the procedure well with no immediate complications   (including critical care time) Labs Review Labs Reviewed - No data to display  Imaging Review No results found. I have personally reviewed and evaluated these images and lab results as part of my medical decision-making.   EKG Interpretation None      MDM  Vital signs are stable. Laceration to the right thumb repaired without problem. Pt to have the sutures removed in 7 to 10 days.Pt to return  sooner if any signs of infection   Final diagnoses:  Laceration of finger, initial encounter    **I have reviewed nursing notes, vital signs, and all appropriate lab and imaging results for this patient.Ivery Quale, PA-C 10/12/15 [id number]  Marily Memos, MD 10/13/15 [phone]

## 2015-10-18 ENCOUNTER — Encounter (HOSPITAL_COMMUNITY): Payer: Self-pay | Admitting: Emergency Medicine

## 2015-10-18 ENCOUNTER — Emergency Department (HOSPITAL_COMMUNITY)
Admission: EM | Admit: 2015-10-18 | Discharge: 2015-10-18 | Disposition: A | Discharge status: Home / Self Care | Payer: Managed Care, Other (non HMO) | Attending: Emergency Medicine | Admitting: Emergency Medicine

## 2015-10-18 DIAGNOSIS — Z792 Long term (current) use of antibiotics: Secondary | ICD-10-CM | POA: Diagnosis not present

## 2015-10-18 DIAGNOSIS — Z880 Allergy status to penicillin: Secondary | ICD-10-CM | POA: Insufficient documentation

## 2015-10-18 DIAGNOSIS — D649 Anemia, unspecified: Secondary | ICD-10-CM | POA: Insufficient documentation

## 2015-10-18 DIAGNOSIS — Z79899 Other long term (current) drug therapy: Secondary | ICD-10-CM | POA: Insufficient documentation

## 2015-10-18 DIAGNOSIS — Z4802 Encounter for removal of sutures: Secondary | ICD-10-CM | POA: Insufficient documentation

## 2015-10-18 DIAGNOSIS — Z793 Long term (current) use of hormonal contraceptives: Secondary | ICD-10-CM | POA: Insufficient documentation

## 2015-10-18 NOTE — ED Notes (Signed)
Pt here for suture removal from right thumb.

## 2015-10-18 NOTE — Discharge Instructions (Signed)

## 2015-10-18 NOTE — ED Provider Notes (Signed)
CSN: [id number]     Arrival date & time 10/18/15  [id number] History   First MD Initiated Contact with Patient 10/18/15 [phone]     Chief Complaint  Patient presents with  . Suture / Staple Removal     (Consider location/radiation/quality/duration/timing/severity/associated sxs/prior Treatment) Patient is a 24 y.o. female presenting with suture removal.  Suture / Staple Removal This is a new problem. The current episode started in the past 7 days. The problem has been rapidly improving. Pertinent negatives include no joint swelling. Nothing aggravates the symptoms. She has tried nothing for the symptoms. The treatment provided significant relief.  Pt here for suture removal.  Pt reports area seems to be healing well  Past Medical History  Diagnosis Date  . Anemia   . Vaginal Pap smear, abnormal     Colposcopy 2010  . Headache     Hx    Past Surgical History  Procedure Laterality Date  . Hernia repair    . Wisdom tooth extraction    . Colposcopy  2010   History reviewed. No pertinent family history. Social History  Substance Use Topics  . Smoking status: Never Smoker   . Smokeless tobacco: Never Used  . Alcohol Use: No   OB History    Gravida Para Term Preterm AB TAB SAB Ectopic Multiple Living   2 2 2       0 2     Review of Systems  Musculoskeletal: Negative for joint swelling.  All other systems reviewed and are negative.     Allergies  Penicillins; Sulfa antibiotics; and Augmentin  Home Medications   Prior to Admission medications   Medication Sig Start Date End Date Taking? Authorizing Provider  acetaminophen (TYLENOL) 500 MG tablet Take 500 mg by mouth every 6 (six) hours as needed for mild pain or headache.     Historical Provider, MD  azithromycin (ZITHROMAX) 250 MG tablet Take 250 mg by mouth daily.    Historical Provider, MD  Ferrous Sulfate Dried 45 MG TBCR Take 1 tablet by mouth daily.    Historical Provider, MD  HYDROcodone-acetaminophen (NORCO/VICODIN)  5-325 MG tablet 1 at hs or q4h prn pain 10/10/15   Ivery QualeHobson Bryant, PA-C  ibuprofen (ADVIL,MOTRIN) 200 MG tablet Take 400-600 mg by mouth every 6 (six) hours as needed for moderate pain.    Historical Provider, MD  norethindrone (CAMILA) 0.35 MG tablet Take 1 tablet by mouth daily.    Historical Provider, MD   BP 122/82 mmHg  Pulse 93  Temp(Src) 97.8 F (36.6 C) (Oral)  Resp 16  Ht 5\' 4"  (1.626 m)  Wt 54.432 kg  BMI 20.59 kg/m2  SpO2 100% Physical Exam  Constitutional: She is oriented to person, place, and time. She appears well-developed and well-nourished.  HENT:  Head: Normocephalic and atraumatic.  Eyes: EOM are normal.  Neck: Normal range of motion.  Abdominal: She exhibits no distension.  Musculoskeletal: Normal range of motion.  Neurological: She is alert and oriented to person, place, and time.  Skin: Skin is warm.  Healing sutured laceration right thumb,  No sign of infection  Ns and nv intact  Psychiatric: She has a normal mood and affect.  Nursing note and vitals reviewed.   ED Course  Procedures (including critical care time) Labs Review Labs Reviewed - No data to display  Imaging Review No results found. I have personally reviewed and evaluated these images and lab results as part of my medical decision-making.   EKG Interpretation None  MDM   Final diagnoses:  Visit for suture removal    An After Visit Summary was printed and given to the patient.    Lonia Skinner [place], PA-C 10/18/15 1027  Benjiman Core, MD 10/18/15 [phone]

## 2018-01-18 ENCOUNTER — Other Ambulatory Visit: Payer: Self-pay | Admitting: Endocrinology

## 2018-01-18 ENCOUNTER — Ambulatory Visit
Admission: RE | Admit: 2018-01-18 | Discharge: 2018-01-18 | Disposition: A | Discharge status: Home / Self Care | Payer: Self-pay | Source: Ambulatory Visit | Attending: Endocrinology | Admitting: Endocrinology

## 2018-01-18 DIAGNOSIS — E041 Nontoxic single thyroid nodule: Secondary | ICD-10-CM

## 2018-01-20 ENCOUNTER — Other Ambulatory Visit: Payer: Self-pay | Admitting: Endocrinology

## 2018-01-20 DIAGNOSIS — E041 Nontoxic single thyroid nodule: Secondary | ICD-10-CM

## 2018-01-27 ENCOUNTER — Ambulatory Visit
Admission: RE | Admit: 2018-01-27 | Discharge: 2018-01-27 | Disposition: A | Discharge status: Home / Self Care | Payer: Managed Care, Other (non HMO) | Source: Ambulatory Visit | Attending: Endocrinology | Admitting: Endocrinology

## 2018-01-27 ENCOUNTER — Other Ambulatory Visit (HOSPITAL_COMMUNITY)
Admission: RE | Admit: 2018-01-27 | Discharge: 2018-01-27 | Disposition: A | Discharge status: Home / Self Care | Payer: 59 | Source: Ambulatory Visit | Attending: Student | Admitting: Student

## 2018-01-27 DIAGNOSIS — E049 Nontoxic goiter, unspecified: Secondary | ICD-10-CM | POA: Insufficient documentation

## 2018-01-27 DIAGNOSIS — E041 Nontoxic single thyroid nodule: Secondary | ICD-10-CM

## 2019-01-29 IMAGING — US US FNA BIOPSY THYROID 1ST LESION
1 series · 13 of 25 positions shown · non-contrast
Comparison: US SOFT TISSUE HEAD AND NECK 01/18/2018

MEDICATIONS:
5 mL 1% lidocaine

COMPLICATIONS:
None immediate.

INDICATION: Indeterminate thyroid nodules of the left mid and right inferior
thyroid. Request is made for fine-needle aspiration of indeterminate
thyroid nodules.

EXAM:
ULTRASOUND GUIDED THYROID FINE NEEDLE ASPIRATION x 2
TECHNIQUE: Informed written consent was obtained from the patient after a
discussion of the risks, benefits and alternatives to treatment.
Questions regarding the procedure were encouraged and answered. A
timeout was performed prior to the initiation of the procedure.

[Series 1: us fna biopsy thyroid 1st lesion · 0.04mm/px · 34 acquisitions, 13 frames shown]
[im 1/34]
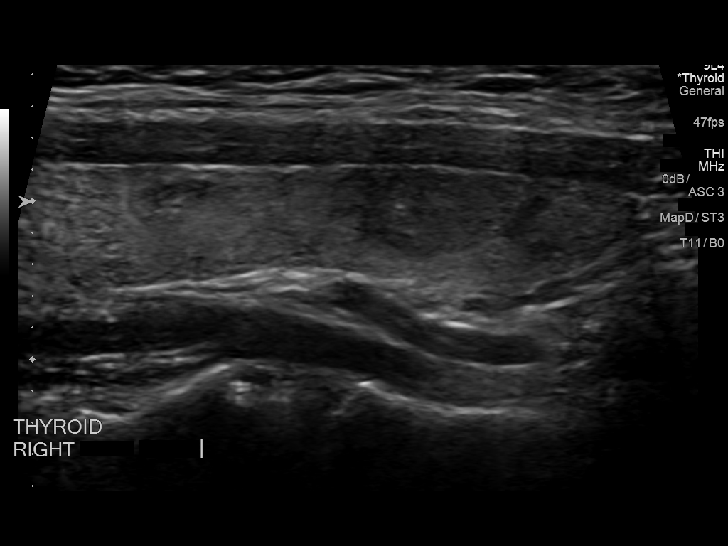
[im 3/34]
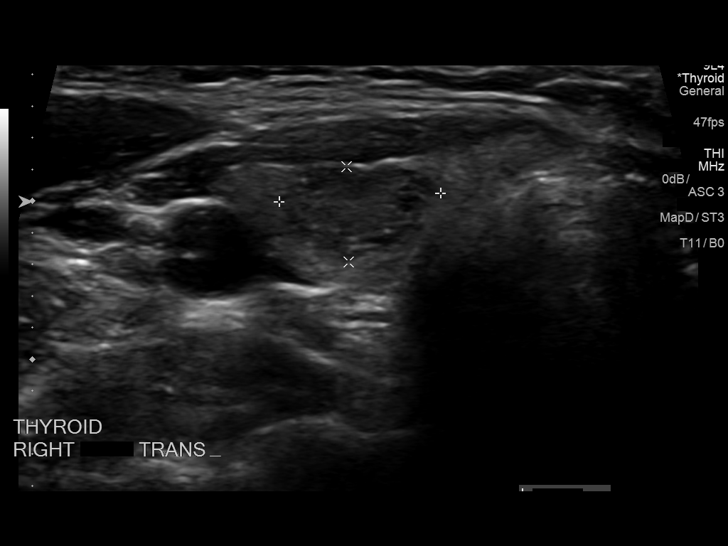
[im 6/34]
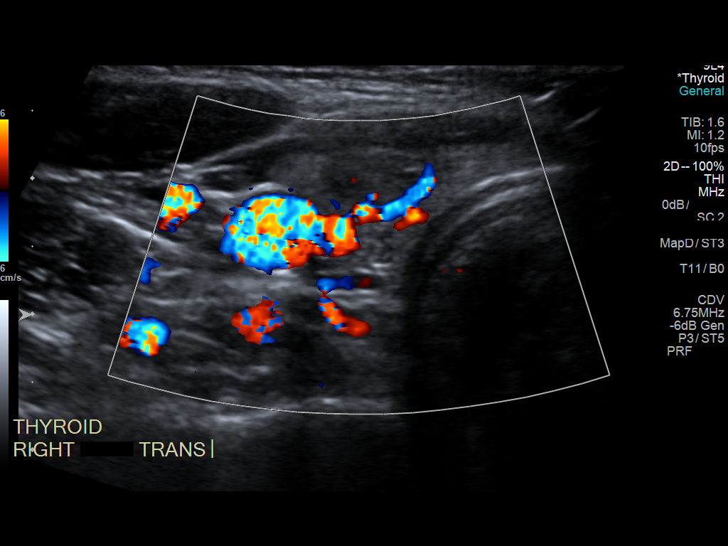
[im 9/34]
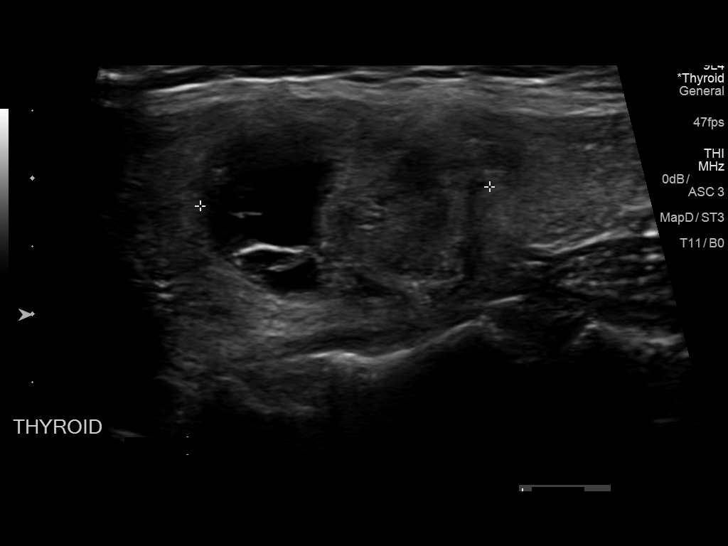
[im 12/34]
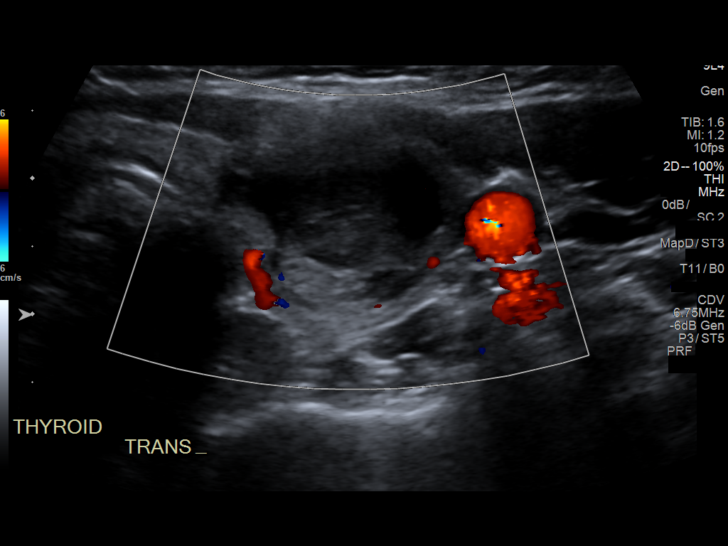
[im 14/34]
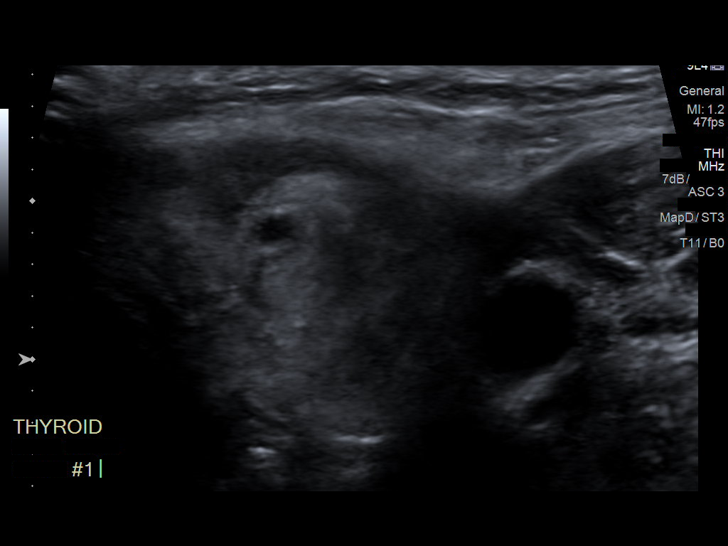
[im 17/34]
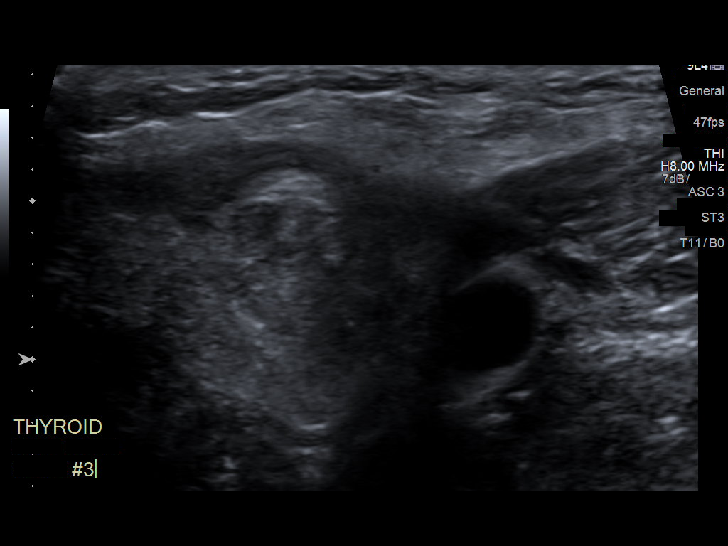
[im 20/34]
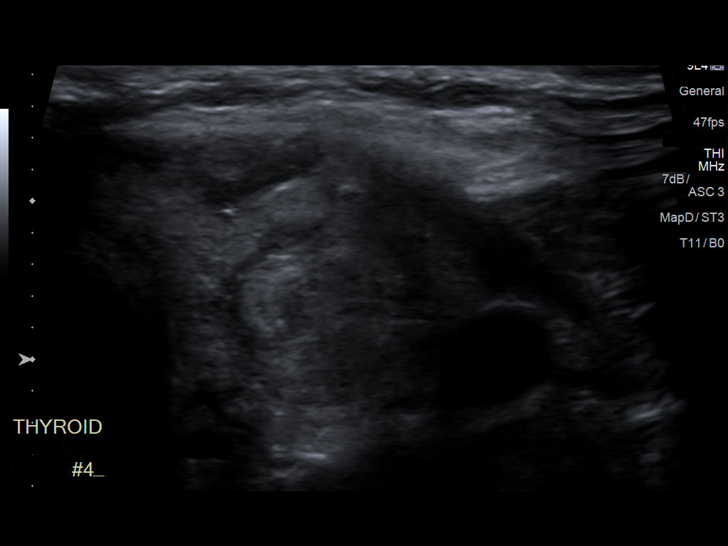
[im 23/34]
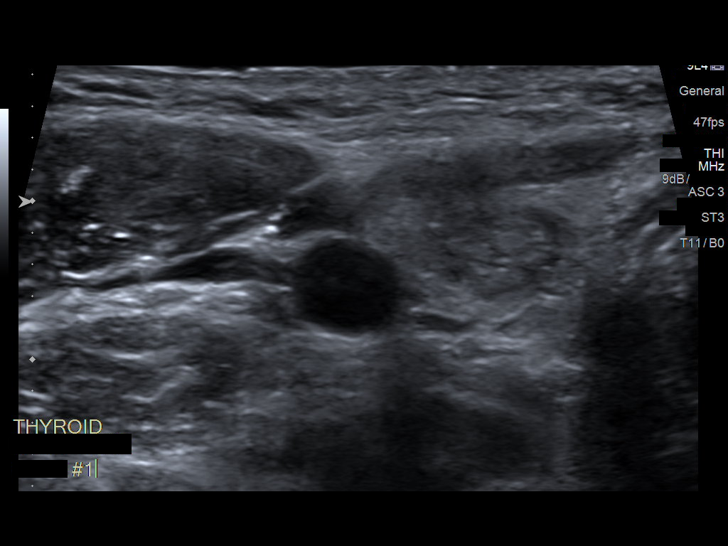
[im 25/34]
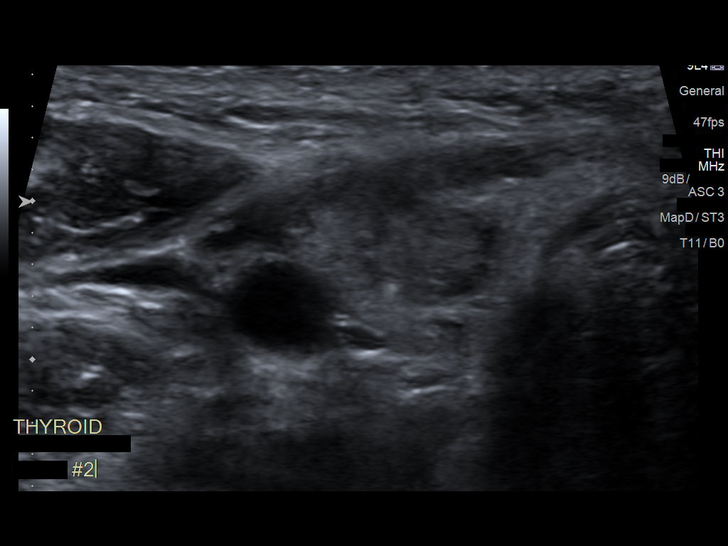
[im 28/34]
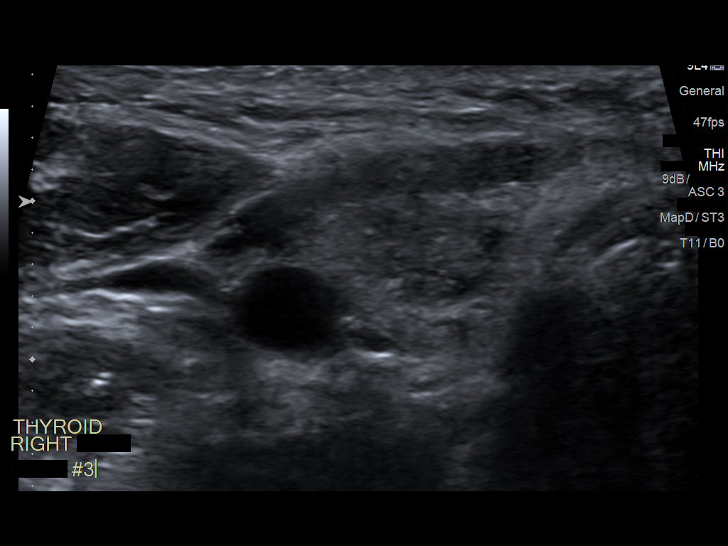
[im 31/34]
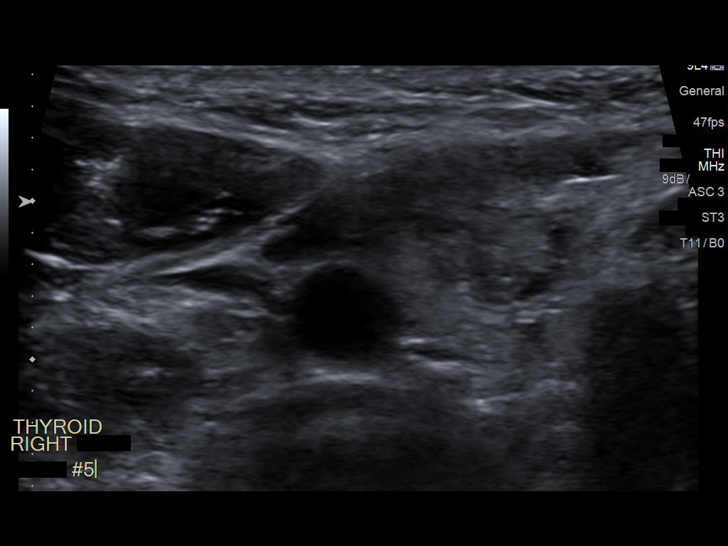
[im 34/34]
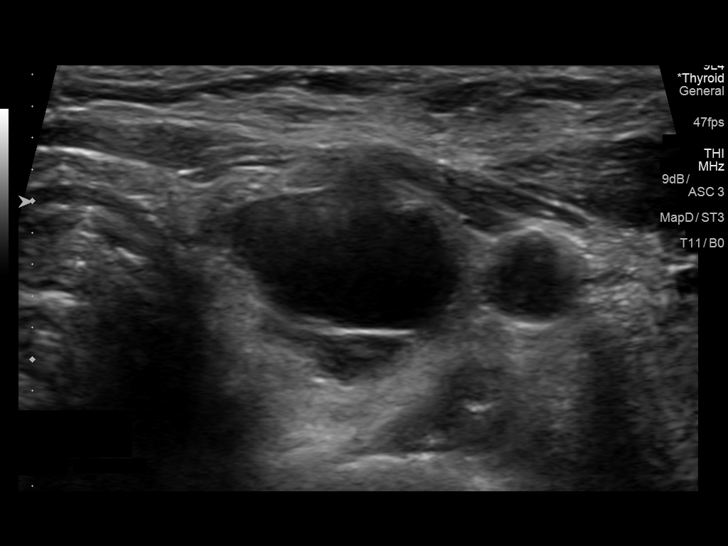

[13 of 25 positions shown; findings below may reference images not displayed]

Pre-procedural ultrasound scanning demonstrated indeterminate
thyroid nodules unchanged in appearance.

The procedures were planned. The neck was prepped in the usual
sterile fashion, and a sterile drape was applied covering the
operative field. A timeout was performed prior to the initiation of
the procedure. Local anesthesia was provided with 1% lidocaine.

Under direct ultrasound guidance, 5 FNA biopsies were performed of
the left mid thyroid nodule with a 27 gauge needle. The samples were
prepared and submitted to pathology. Samples were also sent for
Afirma testing.

Under direct ultrasound guidance, 5 FNA biopsies were performed of
the right inferior thyroid nodule with a 27 gauge needle. The
samples were prepared and submitted to pathology. Samples were also
sent for Afirma testing.

Limited post procedural scanning was negative for hematoma or
additional complication. Dressings were placed. The patient
tolerated the above procedures procedure well without immediate
postprocedural complication.
FINDINGS: Right thyroid nodule is hypoechoic and measures 1.1 x 0.6 x 1.0 cm.
This right thyroid nodule has poorly defined margins and a few
echogenic foci.

Left thyroid nodule is a mixed cystic and solid nodule that measures
2.1 x 1.4 x 1.8 cm.
IMPRESSION: Technically successful ultrasound guided fine needle aspiration of
indeterminate left mid thyroid nodule and right inferior thyroid
nodule.

## 2022-05-13 ENCOUNTER — Other Ambulatory Visit: Payer: Self-pay | Admitting: Endocrinology

## 2022-05-13 DIAGNOSIS — E049 Nontoxic goiter, unspecified: Secondary | ICD-10-CM

## 2022-05-24 ENCOUNTER — Ambulatory Visit
Admission: RE | Admit: 2022-05-24 | Discharge: 2022-05-24 | Disposition: A | Discharge status: Home / Self Care | Payer: 59 | Source: Ambulatory Visit | Attending: Endocrinology | Admitting: Endocrinology

## 2022-05-24 DIAGNOSIS — E049 Nontoxic goiter, unspecified: Secondary | ICD-10-CM

## 2023-05-23 ENCOUNTER — Other Ambulatory Visit: Payer: Self-pay | Admitting: Endocrinology

## 2023-05-23 DIAGNOSIS — E049 Nontoxic goiter, unspecified: Secondary | ICD-10-CM

## 2023-06-13 ENCOUNTER — Other Ambulatory Visit: Payer: Managed Care, Other (non HMO)

## 2023-06-15 ENCOUNTER — Ambulatory Visit
Admission: RE | Admit: 2023-06-15 | Discharge: 2023-06-15 | Disposition: A | Discharge status: Home / Self Care | Payer: Managed Care, Other (non HMO) | Source: Ambulatory Visit | Attending: Endocrinology | Admitting: Endocrinology

## 2023-06-15 DIAGNOSIS — E049 Nontoxic goiter, unspecified: Secondary | ICD-10-CM

## 2024-06-20 ENCOUNTER — Other Ambulatory Visit: Payer: Self-pay | Admitting: Endocrinology

## 2024-06-20 DIAGNOSIS — E049 Nontoxic goiter, unspecified: Secondary | ICD-10-CM

## 2024-06-26 ENCOUNTER — Other Ambulatory Visit

## 2024-06-28 ENCOUNTER — Ambulatory Visit
Admission: RE | Admit: 2024-06-28 | Discharge: 2024-06-28 | Disposition: A | Discharge status: Home / Self Care | Source: Ambulatory Visit | Attending: Endocrinology | Admitting: Endocrinology

## 2024-06-28 DIAGNOSIS — E049 Nontoxic goiter, unspecified: Secondary | ICD-10-CM
